# Patient Record
Sex: Male | Born: 1965 | Race: White | Hispanic: No | Marital: Single | Smoking: Current every day smoker
Health system: Southern US, Community
[De-identification: ages and names within clinical notes are randomized; demographics above are authoritative.]

## PROBLEM LIST (undated history)

## (undated) DIAGNOSIS — M51369 Other intervertebral disc degeneration, lumbar region without mention of lumbar back pain or lower extremity pain: Secondary | ICD-10-CM

## (undated) DIAGNOSIS — M5136 Other intervertebral disc degeneration, lumbar region: Secondary | ICD-10-CM

## (undated) DIAGNOSIS — M1990 Unspecified osteoarthritis, unspecified site: Secondary | ICD-10-CM

## (undated) DIAGNOSIS — M5126 Other intervertebral disc displacement, lumbar region: Secondary | ICD-10-CM

---

## 2015-03-05 ENCOUNTER — Encounter (HOSPITAL_BASED_OUTPATIENT_CLINIC_OR_DEPARTMENT_OTHER): Payer: Self-pay

## 2015-03-05 ENCOUNTER — Emergency Department (HOSPITAL_BASED_OUTPATIENT_CLINIC_OR_DEPARTMENT_OTHER): Payer: Self-pay

## 2015-03-05 ENCOUNTER — Emergency Department (HOSPITAL_BASED_OUTPATIENT_CLINIC_OR_DEPARTMENT_OTHER)
Admission: EM | Admit: 2015-03-05 | Discharge: 2015-03-06 | Disposition: A | Discharge status: Home / Self Care | Payer: Self-pay | Attending: Emergency Medicine | Admitting: Emergency Medicine

## 2015-03-05 DIAGNOSIS — M5432 Sciatica, left side: Secondary | ICD-10-CM | POA: Insufficient documentation

## 2015-03-05 DIAGNOSIS — Y998 Other external cause status: Secondary | ICD-10-CM | POA: Insufficient documentation

## 2015-03-05 DIAGNOSIS — M1990 Unspecified osteoarthritis, unspecified site: Secondary | ICD-10-CM | POA: Insufficient documentation

## 2015-03-05 DIAGNOSIS — W1839XA Other fall on same level, initial encounter: Secondary | ICD-10-CM | POA: Insufficient documentation

## 2015-03-05 DIAGNOSIS — S3992XA Unspecified injury of lower back, initial encounter: Secondary | ICD-10-CM | POA: Insufficient documentation

## 2015-03-05 DIAGNOSIS — Y9289 Other specified places as the place of occurrence of the external cause: Secondary | ICD-10-CM | POA: Insufficient documentation

## 2015-03-05 DIAGNOSIS — Z720 Tobacco use: Secondary | ICD-10-CM | POA: Insufficient documentation

## 2015-03-05 DIAGNOSIS — Y9389 Activity, other specified: Secondary | ICD-10-CM | POA: Insufficient documentation

## 2015-03-05 HISTORY — DX: Other intervertebral disc degeneration, lumbar region: M51.36

## 2015-03-05 HISTORY — DX: Other intervertebral disc displacement, lumbar region: M51.26

## 2015-03-05 HISTORY — DX: Unspecified osteoarthritis, unspecified site: M19.90

## 2015-03-05 HISTORY — DX: Other intervertebral disc degeneration, lumbar region without mention of lumbar back pain or lower extremity pain: M51.369

## 2015-03-05 MED ORDER — PREDNISONE 20 MG PO TABS: ORAL_TABLET | ORAL | Status: DC

## 2015-03-05 MED ORDER — KETOROLAC TROMETHAMINE 60 MG/2ML IM SOLN
60.0000 mg | Freq: Once | INTRAMUSCULAR | Status: AC
  Administered 2015-03-05: 60 mg via INTRAMUSCULAR
  Filled 2015-03-05: qty 2

## 2015-03-05 MED ORDER — OXYCODONE-ACETAMINOPHEN 5-325 MG PO TABS
ORAL_TABLET | ORAL | Status: AC
  Filled 2015-03-05: qty 1

## 2015-03-05 MED ORDER — OXYCODONE-ACETAMINOPHEN 5-325 MG PO TABS
1.0000 | ORAL_TABLET | Freq: Once | ORAL | Status: AC
  Administered 2015-03-05: 1 via ORAL

## 2015-03-05 MED ORDER — CYCLOBENZAPRINE HCL 10 MG PO TABS
10.0000 mg | ORAL_TABLET | Freq: Once | ORAL | Status: AC
  Administered 2015-03-05: 10 mg via ORAL
  Filled 2015-03-05: qty 1

## 2015-03-05 MED ORDER — HYDROMORPHONE HCL 1 MG/ML IJ SOLN
1.0000 mg | Freq: Once | INTRAMUSCULAR | Status: AC
  Administered 2015-03-05: 1 mg via INTRAMUSCULAR
  Filled 2015-03-05: qty 1

## 2015-03-05 MED ORDER — ORPHENADRINE CITRATE ER 100 MG PO TB12: 100.0000 mg | ORAL_TABLET | Freq: Two times a day (BID) | ORAL | Status: DC

## 2015-03-05 MED ORDER — HYDROCODONE-ACETAMINOPHEN 5-325 MG PO TABS: 2.0000 | ORAL_TABLET | ORAL | Status: DC | PRN

## 2015-03-05 NOTE — ED Notes (Signed)
Fell yesterday. Gradual progressive worsening lower back pain since then, radiating down L leg, mentions weakness and numbness tingling, h/o same, no h/o surgery, (denies: loss of control of bowel or bladder, nvd, fever, urinary sx, other injuries), pt works loading and lifting.

## 2015-03-05 NOTE — Discharge Instructions (Signed)
°Radicular Pain °Radicular pain in either the arm or leg is usually from a bulging or herniated disk in the spine. A piece of the herniated disk may press against the nerves as the nerves exit the spine. This causes pain which is felt at the tips of the nerves down the arm or leg. Other causes of radicular pain may include: °· Fractures. °· Heart disease. °· Cancer. °· An abnormal and usually degenerative state of the nervous system or nerves (neuropathy). °Diagnosis may require CT or MRI scanning to determine the primary cause.  °Nerves that start at the neck (nerve roots) may cause radicular pain in the outer shoulder and arm. It can spread down to the thumb and fingers. The symptoms vary depending on which nerve root has been affected. In most cases radicular pain improves with conservative treatment. Neck problems may require physical therapy, a neck collar, or cervical traction. Treatment may take many weeks, and surgery may be considered if the symptoms do not improve.  °Conservative treatment is also recommended for sciatica. Sciatica causes pain to radiate from the lower back or buttock area down the leg into the foot. Often there is a history of back problems. Most patients with sciatica are better after 2 to 4 weeks of rest and other supportive care. Short term bed rest can reduce the disk pressure considerably. Sitting, however, is not a good position since this increases the pressure on the disk. You should avoid bending, lifting, and all other activities which make the problem worse. Traction can be used in severe cases. Surgery is usually reserved for patients who do not improve within the first months of treatment. °Only take over-the-counter or prescription medicines for pain, discomfort, or fever as directed by your caregiver. Narcotics and muscle relaxants may help by relieving more severe pain and spasm and by providing mild sedation. Cold or massage can give significant relief. Spinal  manipulation is not recommended. It can increase the degree of disc protrusion. Epidural steroid injections are often effective treatment for radicular pain. These injections deliver medicine to the spinal nerve in the space between the protective covering of the spinal cord and back bones (vertebrae). Your caregiver can give you more information about steroid injections. These injections are most effective when given within two weeks of the onset of pain.  °You should see your caregiver for follow up care as recommended. A program for neck and back injury rehabilitation with stretching and strengthening exercises is an important part of management.  °SEEK IMMEDIATE MEDICAL CARE IF: °· You develop increased pain, weakness, or numbness in your arm or leg. °· You develop difficulty with bladder or bowel control. °· You develop abdominal pain. °Document Released: 01/17/2005 Document Revised: 03/03/2012 Document Reviewed: 04/04/2009 °ExitCare® Patient Information ©2015 [employer]. This information is not intended to replace advice given to you by your health care provider. Make sure you discuss any questions you have with your health care provider. ° ° °Emergency Department Resource Guide °1) Find a Doctor and Pay Out of Pocket °Although you won't have to find out who is covered by your insurance plan, it is a good idea to ask around and get recommendations. You will then need to call the office and see if the doctor you have chosen will accept you as a new patient and what types of options they offer for patients who are self-pay. Some doctors offer discounts or will set up payment plans for their patients who do not have insurance, but you   will need to ask so you aren't surprised when you get to your appointment. ° °2) Contact Your Local Health Department °Not all health departments have doctors that can see patients for sick visits, but many do, so it is worth a call to see if yours does. If you don't know where  your local health department is, you can check in your phone book. The CDC also has a tool to help you locate your state's health department, and many state websites also have listings of all of their local health departments. ° °3) Find a Walk-in Clinic °If your illness is not likely to be very severe or complicated, you may want to try a walk in clinic. These are popping up all over the country in pharmacies, drugstores, and shopping centers. They're usually staffed by nurse practitioners or physician assistants that have been trained to treat common illnesses and complaints. They're usually fairly quick and inexpensive. However, if you have serious medical issues or chronic medical problems, these are probably not your best option. ° °No Primary Care Doctor: °- Call [facility] at  832-8000 - they can help you locate a primary care doctor that  accepts your insurance, provides certain services, etc. °- Physician Referral Service- 1-800-533-3463 ° °Chronic Pain Problems: °Organization         Address  Phone   Notes  °[facility]  (336) 297-2271 Patients need to be referred by their primary care doctor.  ° °Medication Assistance: °Organization         Address  Phone   Notes  °[place] [facility] [address] °[place], [place] [address] (336) 641-8030 --Must be a resident of [place] °-- Must have NO insurance coverage whatsoever (no Medicaid/ Medicare, etc.) °-- The pt. MUST have a primary care doctor that directs their care regularly and follows them in the community °  °[facility]  (866) 331-1348   °[employer]  (888) 892-1162   ° °Agencies that provide inexpensive medical care: °Organization         Address  Phone   Notes  °[facility]  (336) 832-8035   °[facility]    (336) 832-7272   °[facility] [address] °[place], [place] [address] (336) 832-4777   °[facility] [address], °[place] (336) 271-4999   °[facility]    (336) 373-0678   °[facility]    (336) 272-1050   °[facility] ° [address], [place] Phone:  (336) 832-4444, Fax:  (336) 832-4440 Hours of Operation:  9 am - 6 pm, M-F.  Also accepts Medicaid/Medicare and self-pay.  °[facility] ° [address], [place] Phone: (336) 832-3150, Fax: (336) 832-3151. Hours of Operation:  8:30 am - 5:30 pm, M-F.  Also accepts Medicaid and self-pay.  °[facility] [place] [address], [place] Phone: (336) 878-6027   °[facility] [address], [place], [place] (336)723-1848, Ext. 123 Mondays & Thursdays: 7-9 AM.  First 15 patients are seen on a first come, first serve basis. °  ° °Medicaid-accepting [place] Providers: ° °Organization         Address  Phone   Notes  °[facility] [address], [place] (336) 641-2100 Also accepts self-pay patients.  °[facility] [address],   [place] ° (336) 856-9996   °[facility] [address], [place] (336) 288-8857   °[facility] [address] [place] [address], [place] (336) 299-7000   °Veita Bland [address], [place]  ° (336) 373-1557 Only accepts [place] Medicaid patients after they have their name applied to their card.  ° °Self-Pay (no insurance) in [place]: ° °Organization         Address  Phone   Notes  °Sickle Cell Patients, [facility] [address], [place] (336) 832-1970   °[facility] [address], [place] (336) 832-4400   °[facility] ° [address], [place] (336) 992-4800   °[facility]/Dr. Osei-Bonsu ° [address] [place] [address], [place] or [address], [place] (336) 841-8500 Phone number for both [place] and [place] locations is the same.  °[facility] [address], [place] (336) 299-0000   °[facility] [address] [place] [address], [place] or [address] (336) 852-7530 °(336) 878-2260   °[facility] [address], [place] (336) 350-1642, phone; (336) 294-5005, fax Sees patients 1st and 3rd Saturday of every month.  Must not qualify for public or private insurance (i.e. Medicaid, Medicare, [place] Health Choice, Veterans' Benefits) • Household income should be no more than 200% of the poverty level •The clinic cannot treat you if you are pregnant or think you are pregnant • Sexually transmitted diseases are not treated at the clinic.  ° ° °Dental Care: °Organization         Address  Phone  Notes  °[facility] [address], [place] (336) 641-6152 Accepts children up to age 21 who are enrolled in Medicaid or [place] Health Choice; pregnant women with a Medicaid card; and children who have applied for Medicaid or [place] Health Choice, but were declined, whose parents can pay a reduced fee at time of service.  °[facility] [place]  [address], [place] (336) 641-7733 Accepts children up to age 21 who are enrolled in Medicaid or [place] Health Choice; pregnant women with a Medicaid card; and children who have applied for Medicaid or [place] Health Choice, but were declined, whose parents can pay a reduced fee at time of service.  °[facility] ° [address], [place] (336) 641-4533 Patients are seen by appointment only. Walk-ins are not accepted. [facility] will see patients 18 years of age and older. °Monday - Tuesday (8am-5pm) °Most Wednesdays (8:30-5pm) °$30 per visit, cash only  °[facility] ° [address], [place] (336) 641-4533 Patients are seen by appointment only. Walk-ins are not  accepted. [facility] will see patients 18 years of age and older. °One Wednesday Evening (Monthly: Volunteer Based).  $30 per visit, cash only  °[facility]  (919) 537-3737 for adults; Children under age 4, call Graduate Pediatric Dentistry at (919) [phone]. Children aged 4-14, please call (919) [phone] to request a pediatric application. ° Dental services are provided in all areas of dental care including fillings, crowns and bridges, complete and partial dentures, implants, gum treatment, root canals, and extractions. Preventive care is also provided. Treatment is provided to both adults and children. °Patients are selected via a lottery and   there is often a waiting list. °  °[facility] [address], °[place] ° (336) 763-8833 [email or web address] °  °[facility] [address], [place], [place] (336)723-1848, Ext. 123 Second and Fourth Thursday of each month, opens at 6:30 AM; Clinic ends at 9 AM.  Patients are seen on a first-come first-served basis, and a limited number are seen during each clinic.  ° °[facility] ° [address], [place], [place] (336) 723-7904   Eligibility Requirements °You must have lived in [place], [place], or [place] counties for at least the last three months. °  You cannot be eligible for state or federal sponsored healthcare insurance, including Veterans Administration, Medicaid, or Medicare. °  You generally cannot be eligible for healthcare insurance through your employer.  °  How to apply: °Eligibility screenings are held every Tuesday and Wednesday afternoon from 1:00 pm until 4:00 pm. You do not need an appointment for the interview!  °[facility] [address], [place], [place] 336-631-2330   °[facility]  336-342-8273   °[facility]  336-703-3100   °[facility]  336-570-6415   ° °Behavioral Health Resources in the  Community: °Intensive Outpatient Programs °Organization         Address  Phone  Notes  °[facility] [address], [place], [place] 336-878-6098   °[facility] [address], [place], [place] 336-832-9800   °[facility] [address], [place], [place] ° 336-882-2125   °[facility] [address],  °[place], [place] 1-800-853-5163 or 336-641-4981   °Substance Abuse Resources °Organization         Address  Phone  Notes  °[facility]  336-882-2125   °[facility]  336-784-9470   °[facility]  336-285-9073   °[facility]  336-845-3988   °Residential & Outpatient Substance Abuse Program  1-800-659-3381   °Psychological Services °Organization         Address  Phone  Notes  °[facility]  336- 832-9600   °[facility]  336- 378-7881   °[facility] [address], [place] 1-800-853-5163 or 336-641-4981   ° °Mobile Crisis Teams °Organization         Address  Phone  Notes  °[facility], Mobile Crisis Care Unit  1-877-626-1772   °Assertive °[facility] ° [address] [place], [place] 336-834-9664   °Sharon DeEsch [address] °[place] 336-554-5454   ° °Self-Help/Support Groups °Organization         Address  Phone             Notes  °[facility] [place] [facility] - variety of support groups  336- 373-1402 Call for more information  °Narcotics Anonymous (NA), [facility] [address], °[place]  2 meetings at this location  ° °Residential Treatment Programs °Organization         Address  Phone  Notes  °[facility] [address],    °[place]  1-866-801-8205   °[facility] ° [address], [place], [place] 704-293-8524   °[facility] [address], [place] 336-845-3988 Admissions: 8am-3pm M-F  °[facility] [address]    °[place], [place] 336-841-1104   °[facility] [address], [place], [place]   336-379-7146   °[facility] [address]  °[place], [place] 336-285-9073   °[facility] Intensive Outpatient [address], [place], [place] 336-852-3033   °[facility] [address]  °[place], [place] 1-877-615-2722 or 336-784-9470   °[facility] [address] [place], [place] 336-227-7417 Accepts Medicaid  °[facility] [address]  °[place] 1-800-659-3381 Substance Abuse/Addiction Treatment  ° °[place] Behavioral Health Resources °Organization         Address  Phone  Notes  °[facility]  (888) 581-9988   °Julie Brannon, PhD [address] [place], [place]   (336) 349-5553 or (336) 951-0000   °[facility]   [address] °[place], [place] (336) 349-4454   °[facility] [address], [place], [place] (336) 342-8316 Insurance/Medicaid/sponsorship through [facility]  °[facility] [address]                                    [place], [place] (336) 342-8316 Therapy/tele-psych/case  °[facility] [address]  ° [place],  (336) 349-2233    °Dr. Arfeen  (336) 349-4544   °[facility] [place]  [employer] [place] [facility] 1) [address], [place] °2) [address], [place] °3)  [address], [place] (336) 349-3220 °(336) 342-7768 ° °(336) 342-8140   °[place] [facility] (336) 342-1394 or (336) 342-3537 (After Hours)    ° ° ° ° °

## 2015-03-05 NOTE — ED Notes (Signed)
Pt reports pain in lower lumbar area after a fall today, burning in L leg.  Denies n/t but reports able to control bowel/bladder.  States he has hx of bulging disc in same.

## 2015-03-05 NOTE — ED Provider Notes (Signed)
CSN: [id number]     Arrival date & time 03/05/15  1924 History  This chart was scribed for Arby BarretteMarcy Ura Yingling, MD by Modena JanskyAlbert Thayil, ED Scribe. This patient was seen in room MH06/MH06 and the patient's care was started at 9:31 PM.   Chief Complaint  Patient presents with  . Back Pain   Patient is a 49 y.o. male presenting with back pain. The history is provided by the patient. No language interpreter was used.  Back Pain Location:  Lumbar spine Radiates to:  L posterior upper leg Pain severity:  Moderate Pain is:  Unable to specify Onset quality:  Sudden Duration:  6 hours Timing:  Constant Progression:  Unchanged Chronicity:  Recurrent Context: falling   Relieved by:  None tried Worsened by:  Movement and touching Ineffective treatments:  None tried Associated symptoms: no abdominal pain, no bladder incontinence, no bowel incontinence and no dysuria     HPI Comments: Jordan Jacobs is a 49 y.o. male who presents to the Emergency Department complaining of constant moderate left sided lower back pain that started about 6 hours ago. He reports that he has a hx of a pinched nerve in his back, and thinks that he exacerbated the nerve when he fell 6 hours ago. He states that he fell flat on his back at work today. He reports that the pain radiates to his left lower extremity with associated nausea. He states that movement and palpation exacerbates the pain. He denies any bowel or bladder incontinence, dysuria, or abdominal pain.   Past Medical History  Diagnosis Date  . Bulging lumbar disc   . Arthritis    History reviewed. No pertinent past surgical history. No family history on file. History  Substance Use Topics  . Smoking status: Current Every Day Smoker -- 1.50 packs/day    Types: Cigarettes  . Smokeless tobacco: Not on file  . Alcohol Use: No    Review of Systems  Gastrointestinal: Negative for abdominal pain and bowel incontinence.  Genitourinary: Negative for bladder  incontinence and dysuria.  Musculoskeletal: Positive for back pain.  10 Systems reviewed and all are negative for acute change except as noted in the HPI.  Allergies  Review of patient's allergies indicates no known allergies.  Home Medications   Prior to Admission medications   Medication Sig Start Date End Date Taking? Authorizing Provider  HYDROcodone-acetaminophen (NORCO/VICODIN) 5-325 MG per tablet Take 2 tablets by mouth every 4 (four) hours as needed. 03/05/15   Arby BarretteMarcy Quentavious Rittenhouse, MD  orphenadrine (NORFLEX) 100 MG tablet Take 1 tablet (100 mg total) by mouth 2 (two) times daily. 03/05/15   Arby BarretteMarcy Paelyn Smick, MD  predniSONE (DELTASONE) 20 MG tablet 3 tabs po daily x 3 days, then 2 tabs x 3 days, then 1.5 tabs x 3 days, then 1 tab x 3 days, then 0.5 tabs x 3 days 03/05/15   Arby BarretteMarcy Keyanni Whittinghill, MD   BP 120/90 mmHg  Pulse 81  Temp(Src) 98.5 F (36.9 C) (Oral)  Resp 18  Ht 6\' 4"  (1.93 m)  Wt 210 lb (95.255 kg)  BMI 25.57 kg/m2  SpO2 98% Physical Exam  Constitutional: He is oriented to person, place, and time. He appears well-developed and well-nourished.  HENT:  Head: Normocephalic and atraumatic.  Eyes: EOM are normal. Pupils are equal, round, and reactive to light.  Neck: Neck supple.  Cardiovascular: Normal rate, regular rhythm, normal heart sounds and intact distal pulses.   Pulmonary/Chest: Effort normal and breath sounds normal.  Abdominal: Soft. Bowel  sounds are normal. He exhibits no distension. There is no tenderness.  Musculoskeletal: Normal range of motion. He exhibits tenderness. He exhibits no edema.  Patient versus tenderness to palpation along the lower spine and to the left in the sciatic region. Strength is intact he is standing in the room he is ambulatory he can sit and stand. He has antalgic gait.  Neurological: He is alert and oriented to person, place, and time. He has normal strength. Coordination normal. GCS eye subscore is 4. GCS verbal subscore is 5. GCS motor  subscore is 6.  Skin: Skin is warm, dry and intact.  Psychiatric: He has a normal mood and affect.    ED Course  Procedures (including critical care time) DIAGNOSTIC STUDIES: Oxygen Saturation is 98% on RA, normal by my interpretation.    COORDINATION OF CARE: 9:35 PM- Pt advised of plan for treatment which includes medication and radiology and pt agrees.  Labs Review Labs Reviewed - No data to display  Imaging Review Dg Lumbar Spine Complete  03/05/2015   CLINICAL DATA:  Lumbar pain after fall today.  Burning in left leg.  EXAM: LUMBAR SPINE - COMPLETE 4+ VIEW  COMPARISON:  None.  FINDINGS: Vertebral body alignment, heights and disc space heights are within normal. There is mild spondylosis present. Mild facet arthropathy over the lower lumbar spine. No compression fracture or subluxation.  IMPRESSION: No acute findings.  Minimal spondylosis.   Electronically Signed   By: Elberta Fortis M.D.   On: 03/05/2015 20:04     EKG Interpretation None      MDM  Right-sided sciatica  The patient is ambulatory without lower extremity weakness. He does have radiating pain. At this time there is no indication of an acute surgical emergency. The patient is advised he will need recheck and given signs and symptoms for which return.    Arby Barrette, MD 03/05/15 [phone]

## 2015-03-11 ENCOUNTER — Emergency Department (HOSPITAL_BASED_OUTPATIENT_CLINIC_OR_DEPARTMENT_OTHER)
Admission: EM | Admit: 2015-03-11 | Discharge: 2015-03-11 | Disposition: A | Discharge status: Home / Self Care | Payer: Self-pay | Attending: Emergency Medicine | Admitting: Emergency Medicine

## 2015-03-11 ENCOUNTER — Encounter (HOSPITAL_BASED_OUTPATIENT_CLINIC_OR_DEPARTMENT_OTHER): Payer: Self-pay

## 2015-03-11 DIAGNOSIS — M1990 Unspecified osteoarthritis, unspecified site: Secondary | ICD-10-CM | POA: Insufficient documentation

## 2015-03-11 DIAGNOSIS — K088 Other specified disorders of teeth and supporting structures: Secondary | ICD-10-CM | POA: Insufficient documentation

## 2015-03-11 DIAGNOSIS — Z720 Tobacco use: Secondary | ICD-10-CM | POA: Insufficient documentation

## 2015-03-11 DIAGNOSIS — K0889 Other specified disorders of teeth and supporting structures: Secondary | ICD-10-CM

## 2015-03-11 DIAGNOSIS — Z79899 Other long term (current) drug therapy: Secondary | ICD-10-CM | POA: Insufficient documentation

## 2015-03-11 MED ORDER — OXYCODONE-ACETAMINOPHEN 5-325 MG PO TABS: 1.0000 | ORAL_TABLET | Freq: Four times a day (QID) | ORAL | Status: DC | PRN

## 2015-03-11 MED ORDER — OXYCODONE-ACETAMINOPHEN 5-325 MG PO TABS
1.0000 | ORAL_TABLET | Freq: Once | ORAL | Status: AC
  Administered 2015-03-11: 1 via ORAL
  Filled 2015-03-11: qty 1

## 2015-03-11 MED ORDER — PENICILLIN V POTASSIUM 250 MG PO TABS: 500.0000 mg | ORAL_TABLET | Freq: Three times a day (TID) | ORAL | Status: DC

## 2015-03-11 NOTE — ED Provider Notes (Signed)
CSN: [id number]     Arrival date & time 03/11/15  [id number] History   First MD Initiated Contact with Patient 03/11/15 [id number]     Chief Complaint  Patient presents with  . Dental Pain     (Consider location/radiation/quality/duration/timing/severity/associated sxs/prior Treatment) Patient is a 49 y.o. male presenting with tooth pain. The history is provided by the patient.  Dental Pain  patient with pain for dental fracture. States to broke today. States pain is severe. States anything coming in contact with make it more painful. States he is waiting to have some dental surgery done but all cost around $12,000. States he used to chew tobacco O and his teeth with it.  Past Medical History  Diagnosis Date  . Bulging lumbar disc   . Arthritis    History reviewed. No pertinent past surgical history. No family history on file. History  Substance Use Topics  . Smoking status: Current Every Day Smoker -- 1.50 packs/day    Types: Cigarettes  . Smokeless tobacco: Not on file  . Alcohol Use: No    Review of Systems  Constitutional: Negative for appetite change.  HENT: Positive for dental problem. Negative for voice change.   Respiratory: Negative for chest tightness.   Cardiovascular: Negative for chest pain.      Allergies  Review of patient's allergies indicates no known allergies.  Home Medications   Prior to Admission medications   Medication Sig Start Date End Date Taking? Authorizing Provider  HYDROcodone-acetaminophen (NORCO/VICODIN) 5-325 MG per tablet Take 2 tablets by mouth every 4 (four) hours as needed. 03/05/15   Arby BarretteMarcy Pfeiffer, MD  orphenadrine (NORFLEX) 100 MG tablet Take 1 tablet (100 mg total) by mouth 2 (two) times daily. 03/05/15   Arby BarretteMarcy Pfeiffer, MD  oxyCODONE-acetaminophen (PERCOCET/ROXICET) 5-325 MG per tablet Take 1-2 tablets by mouth every 6 (six) hours as needed for severe pain. 03/11/15   Benjiman CoreNathan Kolby Myung, MD  penicillin v potassium (VEETID) 250 MG tablet Take 2  tablets (500 mg total) by mouth 3 (three) times daily. 03/11/15   Benjiman CoreNathan Tyrian Peart, MD  predniSONE (DELTASONE) 20 MG tablet 3 tabs po daily x 3 days, then 2 tabs x 3 days, then 1.5 tabs x 3 days, then 1 tab x 3 days, then 0.5 tabs x 3 days 03/05/15   Arby BarretteMarcy Pfeiffer, MD   BP 147/96 mmHg  Pulse 79  Temp(Src) 98.4 F (36.9 C) (Oral)  Resp 18  Ht 6\' 3"  (1.905 m)  Wt 210 lb (95.255 kg)  BMI 26.25 kg/m2  SpO2 98% Physical Exam  Constitutional: He is oriented to person, place, and time. He appears well-developed.  Patient appears uncomfortable.  HENT:  Right lower second most posterior tooth broken off laterally. No fluctuance of the gums or swelling of the floor the mouth.  Cardiovascular: Regular rhythm.   Pulmonary/Chest: Effort normal.  Lymphadenopathy:    He has no cervical adenopathy.  Neurological: He is alert and oriented to person, place, and time.  Skin: Skin is warm.    ED Course  Procedures (including critical care time) Labs Review Labs Reviewed - No data to display  Imaging Review No results found.   EKG Interpretation None      MDM   Final diagnoses:  Pain, dental    Patient with dental fracture pain. Will need to follow-up with dentistry. Will give antibiotics and few pain pills.    Benjiman CoreNathan Ariane Ditullio, MD 03/11/15 [phone]

## 2015-03-11 NOTE — ED Notes (Signed)
Pt reports right lower tooth broke this morning. Reports headache and "fever" at this time.

## 2015-03-11 NOTE — Discharge Instructions (Signed)
Dental Pain °A tooth ache may be caused by cavities (tooth decay). Cavities expose the nerve of the tooth to air and hot or cold temperatures. It may come from an infection or abscess (also called a boil or furuncle) around your tooth. It is also often caused by dental caries (tooth decay). This causes the pain you are having. °DIAGNOSIS  °Your caregiver can diagnose this problem by exam. °TREATMENT  °· If caused by an infection, it may be treated with medications which kill germs (antibiotics) and pain medications as prescribed by your caregiver. Take medications as directed. °· Only take over-the-counter or prescription medicines for pain, discomfort, or fever as directed by your caregiver. °· Whether the tooth ache today is caused by infection or dental disease, you should see your dentist as soon as possible for further care. °SEEK MEDICAL CARE IF: °The exam and treatment you received today has been provided on an emergency basis only. This is not a substitute for complete medical or dental care. If your problem worsens or new problems (symptoms) appear, and you are unable to meet with your dentist, call or return to this location. °SEEK IMMEDIATE MEDICAL CARE IF:  °· You have a fever. °· You develop redness and swelling of your face, jaw, or neck. °· You are unable to open your mouth. °· You have severe pain uncontrolled by pain medicine. °MAKE SURE YOU:  °· Understand these instructions. °· Will watch your condition. °· Will get help right away if you are not doing well or get worse. °Document Released: 12/10/2005 Document Revised: 03/03/2012 Document Reviewed: 07/28/2008 °ExitCare® Patient Information ©2015 [employer]. This information is not intended to replace advice given to you by your health care provider. Make sure you discuss any questions you have with your health care provider. °Emergency Department Resource Guide °1) Find a Doctor and Pay Out of Pocket °Although you won't have to find out who is  covered by your insurance plan, it is a good idea to ask around and get recommendations. You will then need to call the office and see if the doctor you have chosen will accept you as a new patient and what types of options they offer for patients who are self-pay. Some doctors offer discounts or will set up payment plans for their patients who do not have insurance, but you will need to ask so you aren't surprised when you get to your appointment. ° °2) Contact Your Local Health Department °Not all health departments have doctors that can see patients for sick visits, but many do, so it is worth a call to see if yours does. If you don't know where your local health department is, you can check in your phone book. The CDC also has a tool to help you locate your state's health department, and many state websites also have listings of all of their local health departments. ° °3) Find a Walk-in Clinic °If your illness is not likely to be very severe or complicated, you may want to try a walk in clinic. These are popping up all over the country in pharmacies, drugstores, and shopping centers. They're usually staffed by nurse practitioners or physician assistants that have been trained to treat common illnesses and complaints. They're usually fairly quick and inexpensive. However, if you have serious medical issues or chronic medical problems, these are probably not your best option. ° °No Primary Care Doctor: °- Call [facility] at  832-8000 - they can help you locate a primary care   doctor that  accepts your insurance, provides certain services, etc. °- Physician Referral Service- 1-800-533-3463 ° °Chronic Pain Problems: °Organization         Address     Phone             Notes  °[facility]  (336) 297-2271 Patients need to be referred by their primary care doctor.  ° °Medication Assistance: °Organization         Address     Phone             Notes  °[place] [facility]  [address] °[place], [place] [address] (336) 641-8030 --Must be a resident of [place] °-- Must have NO insurance coverage whatsoever (no Medicaid/ Medicare, etc.) °-- The pt. MUST have a primary care doctor that directs their care regularly and follows them in the community °  °[facility]  (866) 331-1348   °[employer]  (888) 892-1162   ° °Agencies that provide inexpensive medical care: °Organization         Address     Phone             Notes  °[facility]  (336) 832-8035   °[facility]    (336) 832-7272   °[facility] [address] °[place], [place] [address] (336) 832-4777   °[facility] [address], °[place] (336) 271-4999   °[facility]    (336) 373-0678   °[facility]    (336) 272-1050   °[facility] ° [address], [place] Phone:  (336) 832-4444, Fax:  (336) 832-4440 Hours of Operation:  9 am - 6 pm, M-F.  Also accepts Medicaid/Medicare and self-pay.  °[facility] ° [address], [place] Phone: (336) 832-3150, Fax: (336) 832-3151. Hours of Operation:  8:30 am - 5:30 pm, M-F.  Also accepts Medicaid and self-pay.  °[facility] [place] [address], [place] Phone: (336) 878-6027   °[facility] ° ° ° ° [address], [place], [place] (336)723-1848, Ext. 123 Mondays & Thursdays: 7-9 AM.  First 15 patients are seen on a first come, first serve basis. °  °[facility] [address] °[place], [place] [address] (336) 349-3220 Accepts Medicaid  ° °Medicaid-accepting [place] Providers: ° °Organization         Address     Phone             Notes  °[facility] [address], [place] (336) 641-2100 Also accepts self-pay patients.  °[facility] [address], [place] ° (336) 856-9996   °[facility] [address], [place] (336) 288-8857   °[facility] [address] [place] [address], [place] (336) 299-7000   °Veita Bland [address], [place]  ° (336) 373-1557 Only accepts [place] Access Medicaid patients after they have their name applied to their card.  ° °Self-Pay (no insurance) in [place]: ° °Organization         Address     Phone             Notes  °Sickle Cell Patients, [facility] [address], [place] (336) 832-1970   ° [facility] [address],  (  336) 832-4400   °[facility] ° [address], [place] (336) 992-4800   °[facility]/Dr. Osei-Bonsu ° [address] [place] [address], [place] or [address], [place] (336) 841-8500 Phone number for both [place] and [place] locations is the same.  °[facility] [address], [place] (336) 299-0000   °[facility] [address] [place] [address], [place] or [address] (336) 852-7530 °(336) 878-2260   °[facility] [address], [place] (336) 350-1642, phone; (336) 294-5005, fax Sees patients 1st and 3rd Saturday of every month.  Must not qualify for public or private insurance (i.e. Medicaid, Medicare, [place] Health Choice, Veterans' Benefits) • Household income should be no more than 200% of the poverty level •The clinic cannot treat you if you are pregnant or think you are pregnant • Sexually transmitted diseases are not treated at the clinic.  ° ° °Dental Care: ° °Organization         Address     Phone             Notes  °[facility] [address], [place] (336) 641-6152 Accepts children up to age 21 who are enrolled in Medicaid or [place] Health Choice; pregnant women with a Medicaid card; and children who have applied for Medicaid or  [place] Health Choice, but were declined, whose  parents can pay a reduced fee at time of service.  °[facility] [place]  [address], [place] (336) 641-7733 Accepts children up to age 21 who are enrolled in Medicaid or [place] Health Choice; pregnant women with a Medicaid card; and children who have applied for Medicaid or [place] Health Choice, but were declined, whose parents can pay a reduced fee at time of service.  °[facility] ° [address], [place] (336) 641-4533 Patients are seen by appointment only. Walk-ins are not accepted. [facility] will see patients 18 years of age and older. °Monday - Tuesday (8am-5pm) °Most Wednesdays (8:30-5pm) °$30 per visit, cash only  °[facility] ° [address], [place] (336) 641-4533 Patients are seen by appointment only. Walk-ins are not accepted. [facility] will see patients 18 years of age and older. °One Wednesday Evening (Monthly: Volunteer Based).  $30 per visit, cash only  °[facility]  (919) 537-3737 for adults; Children under age 4, call Graduate Pediatric Dentistry at (919) [phone]. Children aged 4-14, please call (919) [phone] to request a pediatric application. ° Dental services are provided in all areas of dental care including fillings, crowns and bridges, complete and partial dentures, implants, gum treatment, root canals, and extractions. Preventive care is also provided. Treatment is provided to both adults and children. °Patients are selected via a lottery and there is often a waiting list. °  °[facility] [address], °[place] ° (336) 763-8833 [email or web address] °  °[facility] [address], [place], [place] (336)723-1848, Ext. 123 Second and Fourth Thursday of each month, opens at 6:30 AM; Clinic ends at 9 AM.  Patients are seen on a first-come first-served basis, and a limited number are seen during each clinic.  ° °[facility] °  [address], [place], [place] (336) 723-7904   Eligibility Requirements °You must have lived in [place], [place], or [place]   counties for at least the last three months. °  You cannot be eligible for state or federal sponsored healthcare insurance, including Veterans Administration, Medicaid, or Medicare. °  You generally cannot be eligible for healthcare insurance through your employer.  °  How to apply: °Eligibility screenings are held every Tuesday and Wednesday afternoon from 1:00 pm until 4:00 pm. You do not need an appointment for the interview!  °[facility] [address], [place], [place] 336-631-2330   °[facility]  336-342-8273   °[facility]  336-703-3100   °[facility]  336-570-6415   ° °Behavioral Health Resources in the Community: °Intensive Outpatient Programs °Organization         Address     Phone             Notes  °[facility] [address], [place], [place] 336-878-6098   °[facility] [address], [place], [place] 336-832-9800   °[facility] [address], [place], [place] ° 336-882-2125   °[facility] [address],  °[place], [place] 1-800-853-5163 or 336-641-4981   ° ° °Substance Abuse Resources °Organization         Address     Phone             Notes  °[facility]  336-882-2125   °[facility]  336-784-9470   °[facility]  336-285-9073   °[facility]  336-845-3988   °Residential & Outpatient Substance Abuse Program  1-800-659-3381   °Psychological Services °Organization         Address     Phone             Notes  °[facility]  336- 832-9600   °[facility]  336- 378-7881   °[facility] [address], [place] 1-800-853-5163 or 336-641-4981   ° °Mobile Crisis Teams °Organization         Address     Phone             Notes  °[facility], Mobile Crisis Care Unit  1-877-626-1772   °Assertive °[facility] ° [address] [place], [place] 336-834-9664   °Sharon DeEsch [address] °[place] 336-554-5454   ° °Self-Help/Support Groups °Organization         Address     Phone             Notes  °[facility] [place] - variety of support groups  336- 373-1402 Call for more information  °Narcotics Anonymous (NA), [facility] [address], °[place]  2 meetings at this location  ° °Residential Treatment Programs °Organization         Address     Phone             Notes  °[facility] [address],    °[place]  1-866-801-8205   °[facility] ° [address], [place], [place] 704-293-8524   °[facility] [address], [place] 336-845-3988 Admissions: 8am-3pm M-F  °[facility] [address]    °[place], [place] 336-841-1104   °[facility] [address], [place], [place] 336-379-7146   °[facility] [address]  °[place],  336-285-9073   °[facility] - Intensive Outpatient [address], [place], [place] 336-852-3033   °[facility] [address]  °[place], [place] 1-877-615-2722 or 336-784-9470   °[facility] ([facility] [address] [place], [place] 336-227-7417 Accepts Medicaid  °[facility] [address]  °[place] 1-800-659-3381 Substance Abuse/Addiction Treatment  ° °[place] Behavioral Health Resources °Organization         Address     Phone             Notes  °[facility]  (888) 581-9988   °Julie Brannon, PhD [address] [place], [place]   (336) 349-5553 or (336) 951-0000   °[facility]   [address] °[place], [place] (336) 349-4454   °[facility] [address], [place], [place] (336) 342-8316 Insurance/Medicaid/sponsorship through  [facility]  °[facility] [address]                                    [place], [place] (336) 342-8316 Therapy/tele-psych/case  °[facility] [address]  ° [place], [place] (336) 349-2233    °Dr. Arfeen  (336) 349-4544   °[facility] [place]  [employer] [place] [facility] 1) [address], [place] °2) [address], [place] °3)  [address], [place] (336) 349-3220 °(336) 342-7768 ° °(336) 342-8140   °[place] [facility] (336) 342-1394 or (336) 342-3537 (After Hours)    ° °   °

## 2015-03-11 NOTE — ED Notes (Signed)
Pt advised me that he was not driving, stated his male friend would be driving him home. Informed patient that we would need to see his driver at discharge due to him receiving a narcotic pain medicine on site. Pt states understanding. Pt assisted to waiting room. Within 5 minutes, pt noted by registration staff and Security to be in ED parking lot, driving away in his car. [employer] notified that patient was driving a small silver compact 4 door car.

## 2015-11-19 ENCOUNTER — Encounter (HOSPITAL_BASED_OUTPATIENT_CLINIC_OR_DEPARTMENT_OTHER): Payer: Self-pay | Admitting: Emergency Medicine

## 2015-11-19 ENCOUNTER — Emergency Department (HOSPITAL_BASED_OUTPATIENT_CLINIC_OR_DEPARTMENT_OTHER)
Admission: EM | Admit: 2015-11-19 | Discharge: 2015-11-19 | Disposition: A | Discharge status: Home / Self Care | Payer: Self-pay | Attending: Emergency Medicine | Admitting: Emergency Medicine

## 2015-11-19 DIAGNOSIS — X58XXXA Exposure to other specified factors, initial encounter: Secondary | ICD-10-CM | POA: Insufficient documentation

## 2015-11-19 DIAGNOSIS — Y9289 Other specified places as the place of occurrence of the external cause: Secondary | ICD-10-CM | POA: Insufficient documentation

## 2015-11-19 DIAGNOSIS — Y998 Other external cause status: Secondary | ICD-10-CM | POA: Insufficient documentation

## 2015-11-19 DIAGNOSIS — Y9389 Activity, other specified: Secondary | ICD-10-CM | POA: Insufficient documentation

## 2015-11-19 DIAGNOSIS — F17210 Nicotine dependence, cigarettes, uncomplicated: Secondary | ICD-10-CM | POA: Insufficient documentation

## 2015-11-19 DIAGNOSIS — S8392XA Sprain of unspecified site of left knee, initial encounter: Secondary | ICD-10-CM | POA: Insufficient documentation

## 2015-11-19 DIAGNOSIS — Z8739 Personal history of other diseases of the musculoskeletal system and connective tissue: Secondary | ICD-10-CM | POA: Insufficient documentation

## 2015-11-19 MED ORDER — IBUPROFEN 200 MG PO TABS
600.0000 mg | ORAL_TABLET | Freq: Once | ORAL | Status: AC
  Administered 2015-11-19: 600 mg via ORAL
  Filled 2015-11-19: qty 1

## 2015-11-19 MED ORDER — DIAZEPAM 5 MG PO TABS
5.0000 mg | ORAL_TABLET | Freq: Once | ORAL | Status: AC
  Administered 2015-11-19: 5 mg via ORAL
  Filled 2015-11-19: qty 1

## 2015-11-19 NOTE — ED Notes (Signed)
MD at bedside. 

## 2015-11-19 NOTE — ED Notes (Signed)
Pt claims her was seen and treated for [facility] pain [facility] 11/[facility] [place] [facility], unable to locate records

## 2015-11-19 NOTE — ED Notes (Signed)
Pt left before allot time to ensure medication was effective

## 2015-11-19 NOTE — ED Notes (Signed)
Presents with left knee pain, has knee immobilizer on. Today having increased pain and spasms in left knee, unable to walk without having increased pain.

## 2015-11-19 NOTE — ED Notes (Signed)
Pt states has been taking Norco, but no longer has any meds per pt statement.
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
## 2015-11-19 NOTE — Discharge Instructions (Signed)

## 2016-03-10 IMAGING — DX DG LUMBAR SPINE COMPLETE 4+V
5 series · 5 of 5 positions shown · non-contrast
Comparison: None.

CLINICAL DATA: Lumbar pain after fall today.  Burning in left leg.

EXAM:
LUMBAR SPINE - COMPLETE 4+ VIEW

[l-spine ap]
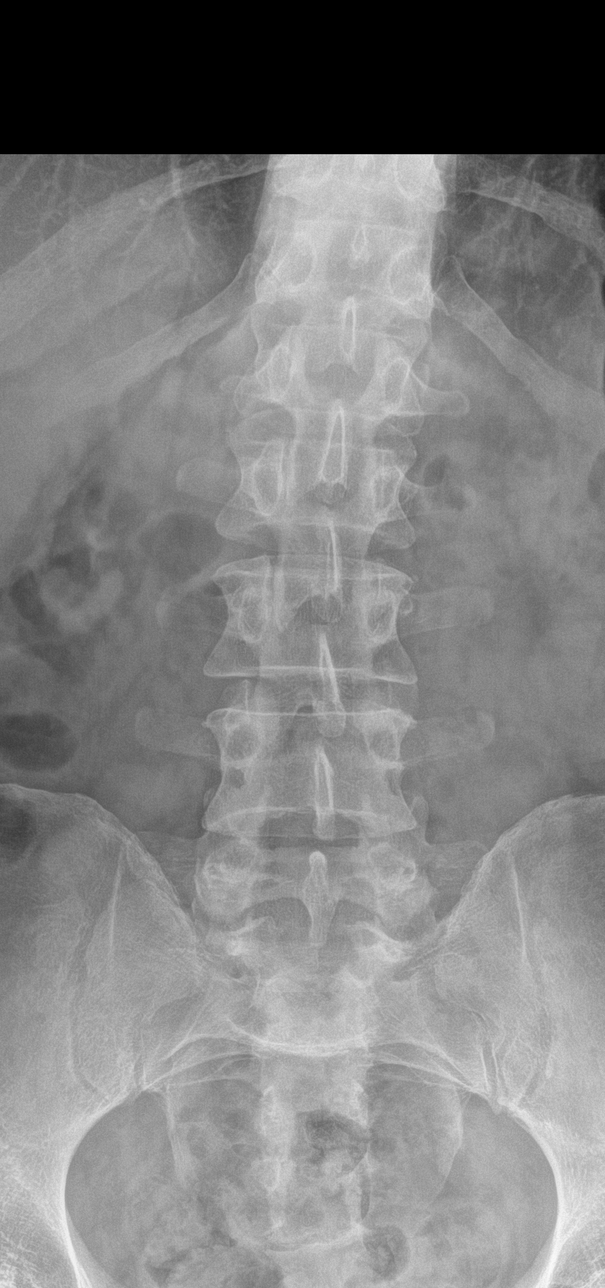

[l-spine obl (1 of 2)]
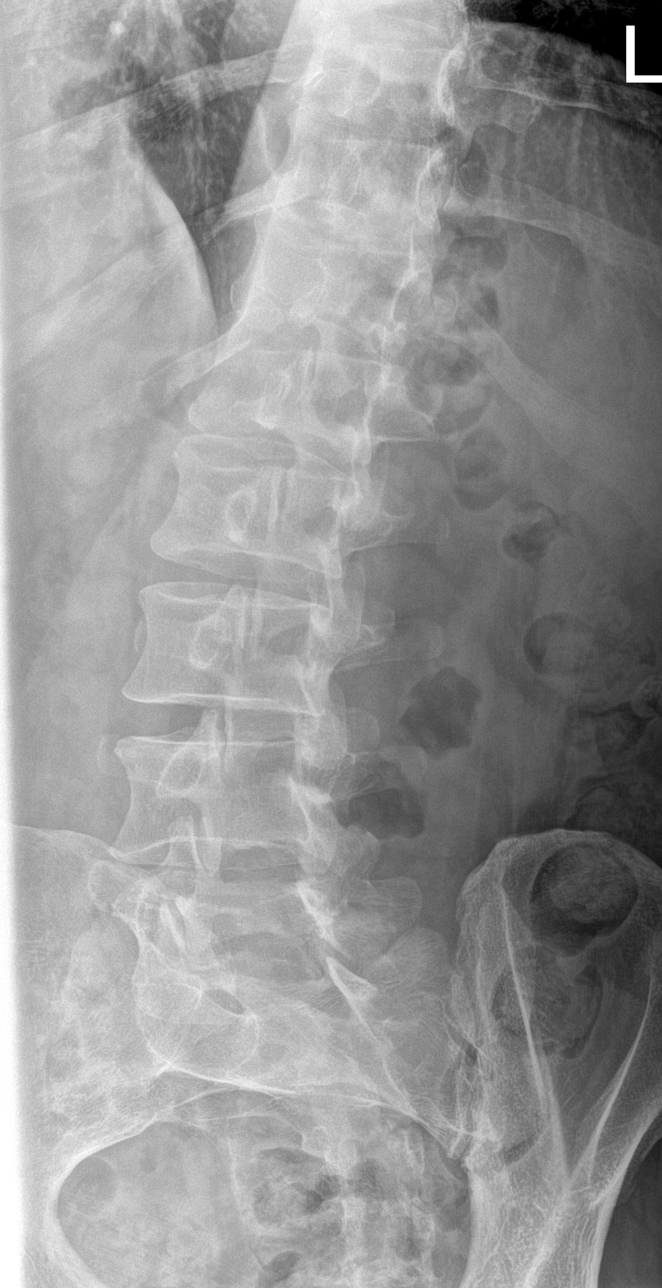

[l-spine obl (2 of 2)]
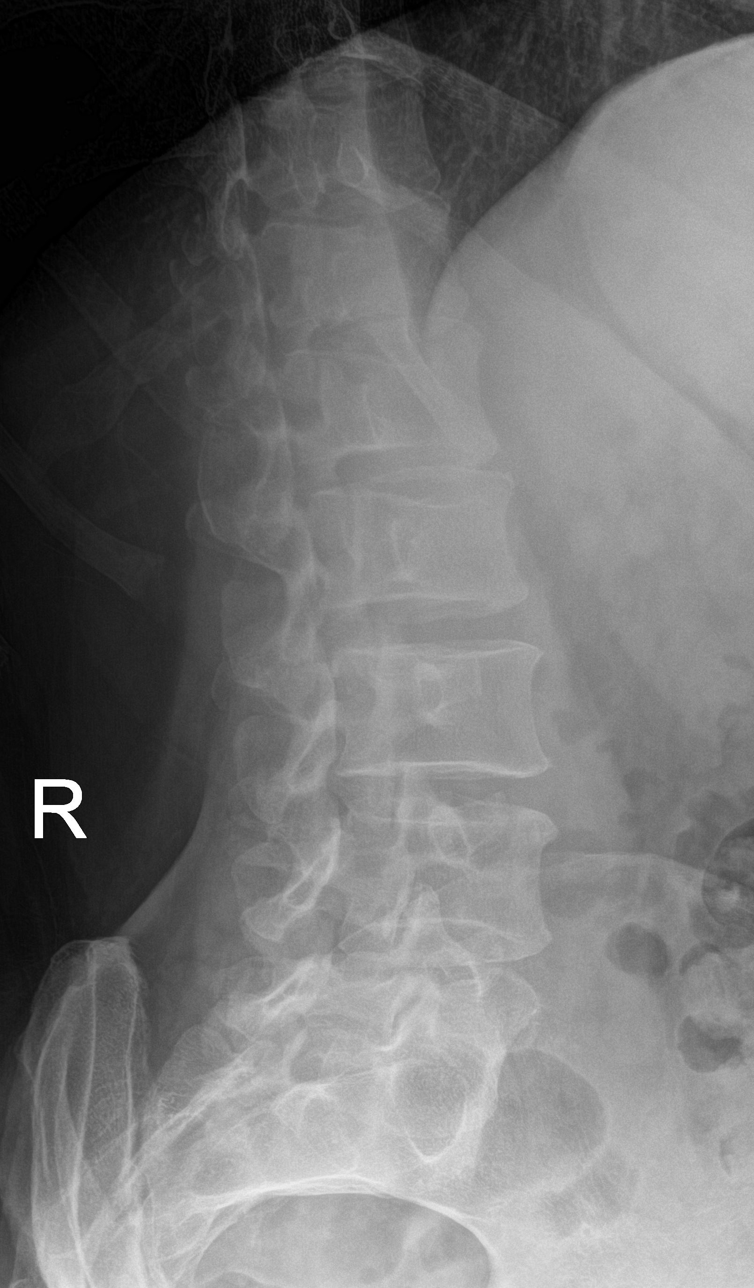

[l-spine lat]
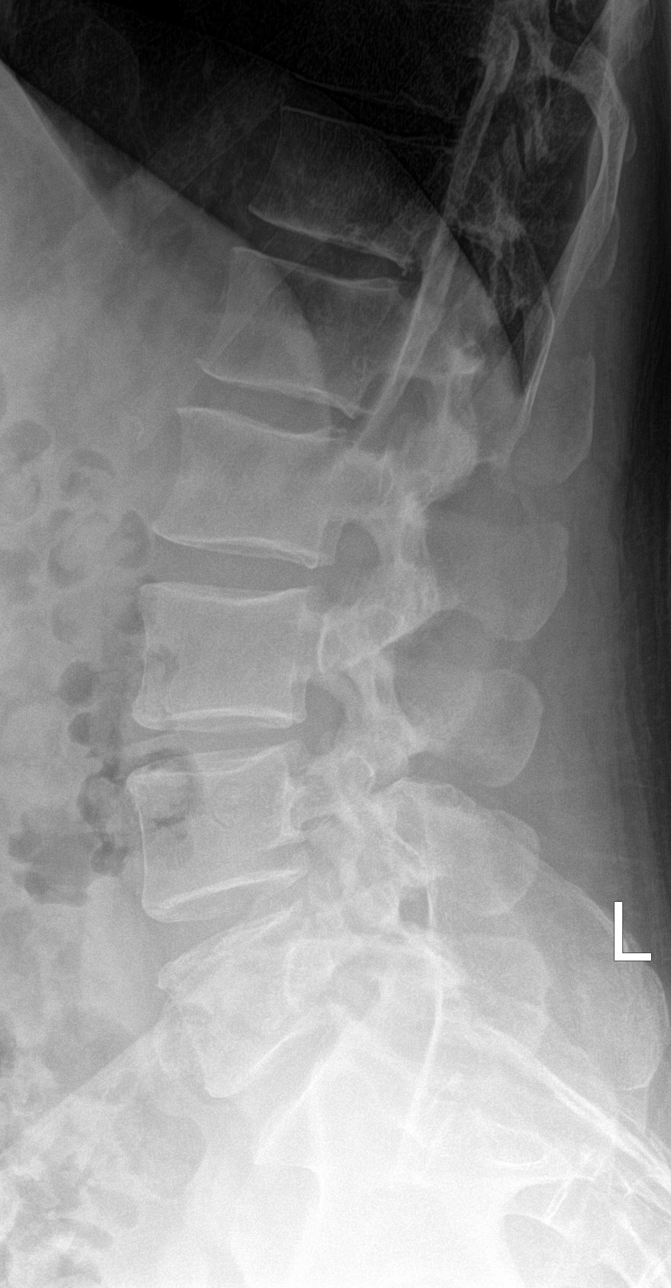

[l-spine spot]
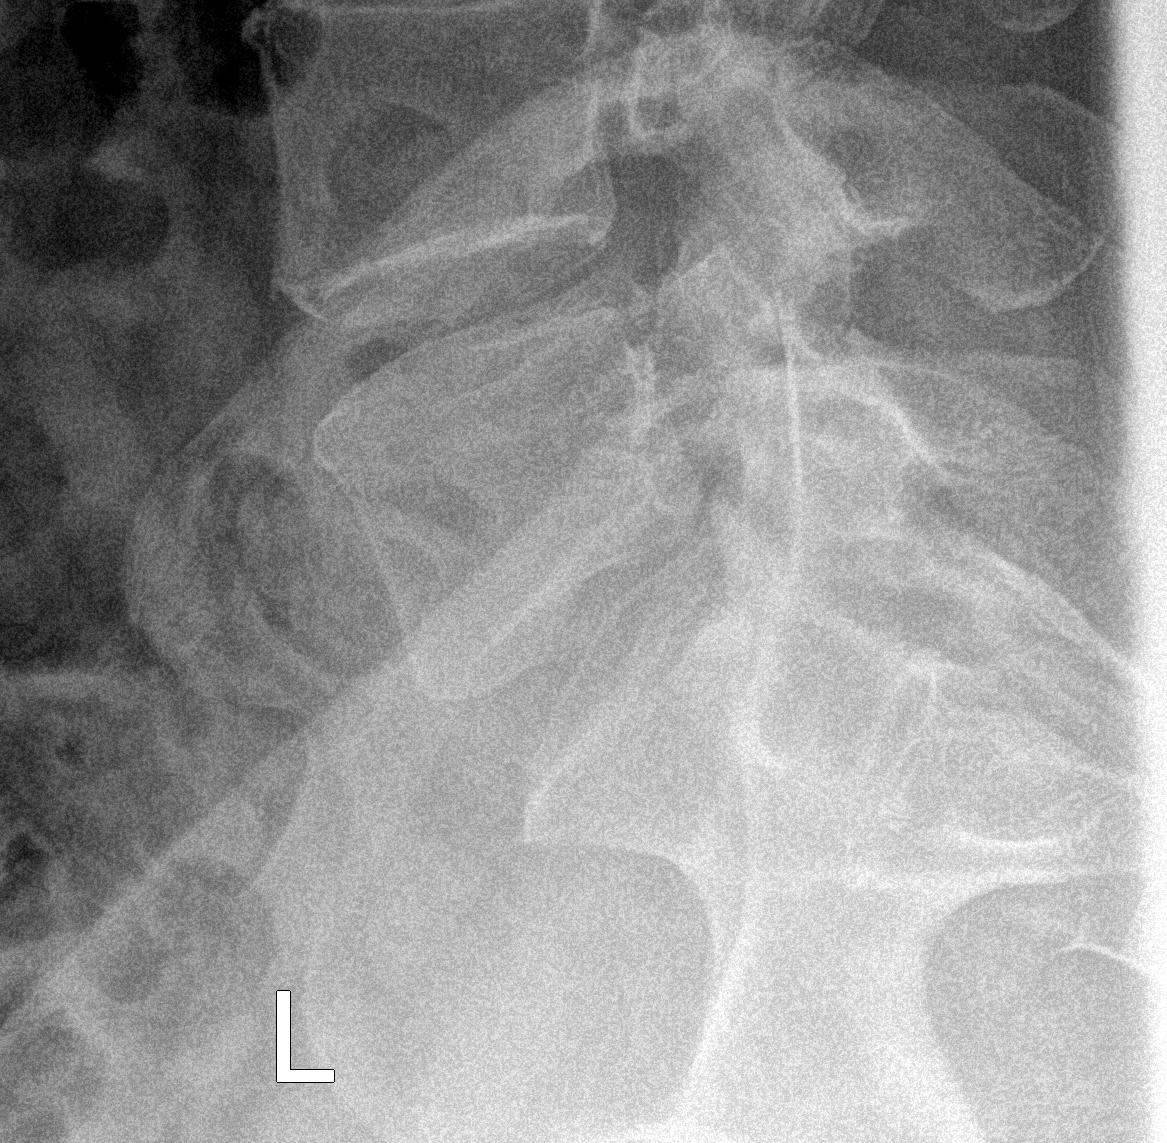

[5 of 5 positions shown; findings below may reference images not displayed]

FINDINGS: Vertebral body alignment, heights and disc space heights are within
normal. There is mild spondylosis present. Mild facet arthropathy
over the lower lumbar spine. No compression fracture or subluxation.
IMPRESSION: No acute findings.

Minimal spondylosis.
